# Patient Record
Sex: Female | Born: 2000 | Hispanic: No | Marital: Single | Smoking: Never smoker
Health system: Southern US, Community
[De-identification: ages and names within clinical notes are randomized; demographics above are authoritative.]

## PROBLEM LIST (undated history)

## (undated) ENCOUNTER — Inpatient Hospital Stay (HOSPITAL_COMMUNITY): Payer: Self-pay

## (undated) DIAGNOSIS — J45909 Unspecified asthma, uncomplicated: Secondary | ICD-10-CM

## (undated) HISTORY — PX: BLADDER SURGERY: SHX569

---
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
## 2019-06-14 ENCOUNTER — Other Ambulatory Visit: Payer: Self-pay

## 2019-06-14 ENCOUNTER — Encounter (HOSPITAL_COMMUNITY): Payer: Self-pay

## 2019-06-14 ENCOUNTER — Emergency Department (HOSPITAL_COMMUNITY)
Admission: EM | Admit: 2019-06-14 | Discharge: 2019-06-14 | Disposition: A | Discharge status: Home / Self Care | Payer: Medicaid Other | Attending: Emergency Medicine | Admitting: Emergency Medicine

## 2019-06-14 DIAGNOSIS — R590 Localized enlarged lymph nodes: Secondary | ICD-10-CM | POA: Diagnosis not present

## 2019-06-14 DIAGNOSIS — R0981 Nasal congestion: Secondary | ICD-10-CM | POA: Insufficient documentation

## 2019-06-14 DIAGNOSIS — Z20822 Contact with and (suspected) exposure to covid-19: Secondary | ICD-10-CM | POA: Diagnosis not present

## 2019-06-14 DIAGNOSIS — R519 Headache, unspecified: Secondary | ICD-10-CM | POA: Diagnosis not present

## 2019-06-14 DIAGNOSIS — J029 Acute pharyngitis, unspecified: Secondary | ICD-10-CM | POA: Insufficient documentation

## 2019-06-14 DIAGNOSIS — R05 Cough: Secondary | ICD-10-CM | POA: Insufficient documentation

## 2019-06-14 LAB — I-STAT BETA HCG BLOOD, ED (MC, WL, AP ONLY): I-stat hCG, quantitative: <5 m[IU]/mL (ref <5)

## 2019-06-14 LAB — POC SARS CORONAVIRUS 2 AG -  ED: SARS Coronavirus 2 Ag: NEGATIVE

## 2019-06-14 LAB — SARS CORONAVIRUS 2 (TAT 6-24 HRS): SARS Coronavirus 2: NEGATIVE

## 2019-06-14 LAB — GROUP A STREP BY PCR: Group A Strep by PCR: NOT DETECTED

## 2019-06-14 MED ORDER — ACETAMINOPHEN 500 MG PO TABS
1000.0000 mg | ORAL_TABLET | Freq: Once | ORAL | Status: AC
  Administered 2019-06-14: 1000 mg via ORAL
  Filled 2019-06-14: qty 2

## 2019-06-14 MED ORDER — ACETAMINOPHEN 500 MG PO TABS: 500.0000 mg | ORAL_TABLET | Freq: Four times a day (QID) | ORAL | 0 refills | Status: DC | PRN

## 2019-06-14 MED ORDER — FLUTICASONE PROPIONATE 50 MCG/ACT NA SUSP: 2.0000 | Freq: Every day | NASAL | 0 refills | Status: DC

## 2019-06-14 MED ORDER — BENZONATATE 100 MG PO CAPS: 100.0000 mg | ORAL_CAPSULE | Freq: Three times a day (TID) | ORAL | 0 refills | Status: DC | PRN

## 2019-06-14 MED ORDER — DEXAMETHASONE SODIUM PHOSPHATE 10 MG/ML IJ SOLN
10.0000 mg | Freq: Once | INTRAMUSCULAR | Status: AC
Start: 2019-06-14 — End: 2019-06-14
  Administered 2019-06-14: 10 mg via INTRAMUSCULAR
  Filled 2019-06-14: qty 1

## 2019-06-14 MED ORDER — IBUPROFEN 600 MG PO TABS: 600.0000 mg | ORAL_TABLET | Freq: Four times a day (QID) | ORAL | 0 refills | Status: DC | PRN

## 2019-06-14 MED ORDER — PHENOL 1.4 % MT LIQD: 1.0000 | OROMUCOSAL | 0 refills | Status: DC | PRN

## 2019-06-14 NOTE — ED Provider Notes (Signed)
[facility] EMERGENCY DEPARTMENT Provider Note   CSN: [id number] Arrival date & time: 06/14/19  1247     History No chief complaint on file.   Donna Cobb is a 19 y.o. female presents for evaluation of acute onset, persistent sore throat, headaches, nasal congestion, mild cough for 5 days.  Reports generalized body aches.  Denies shortness of breath, chest pain, abdominal pain.  Also notes that her menstrual cycle has been abnormal and that she started experiencing vaginal spotting 2 days ago.  She has the Nexplanon in place and has had irregular periods with it previously.  Has been taking Tylenol for symptoms.  No known sick contacts, no known Covid exposures.  The history is provided by the patient.       Past Medical History:  Diagnosis Date  . Asthma     Patient Active Problem List   Diagnosis Date Noted  . PPH (postpartum hemorrhage) 02/07/2017  . Nexplanon in place 02/07/2017  . Vacuum-assisted cesarean delivery, delivered, current hospitalization 02/05/2017  . GBS (group B Streptococcus carrier), +RV culture, currently pregnant 01/08/2017  . Limited prenatal care in third trimester 01/06/2017  . 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 Supervision of normal first teen pregnancy in second trimester 10/15/2016  . Supervision of low-risk pregnancy 07/25/2016    Past Surgical History:  Procedure Laterality Date  . BLADDER SURGERY     19 years old "bladder turned the wrong way, difficulty urinating"     OB History    Gravida  2   Para  1   Term  1   Preterm  0   AB  1   Living  1     SAB  1   TAB  0   Ectopic  0   Multiple  0   Live Births  1           Family History  Problem Relation Age of Onset  . Depression Neg Hx     Social History   Tobacco Use  . Smoking status: Never Smoker  . Smokeless tobacco: Never Used  . Tobacco comment: quit in May 2018  Substance Use Topics  . Alcohol  use: Yes  . Drug use: Not Currently    Types: Marijuana    Comment: used before pregnancy    Home Medications Prior to Admission medications   Medication Sig Start Date End Date Taking? Authorizing Provider  acetaminophen (TYLENOL) 500 MG tablet Take 1 tablet (500 mg total) by mouth every 6 (six) hours as needed. 06/14/19   Deloma Spindle A, PA-C  benzonatate (TESSALON) 100 MG capsule Take 1 capsule (100 mg total) by mouth 3 (three) times daily as needed for cough. 06/14/19   Jo-Ann Johanning A, PA-C  fluticasone (FLONASE) 50 MCG/ACT nasal spray Place 2 sprays into both nostrils daily. 06/14/19   Zariah Cavendish A, PA-C  ibuprofen (ADVIL) 600 MG tablet Take 1 tablet (600 mg total) by mouth every 6 (six) hours as needed. 06/14/19   Thaily Hackworth A, PA-C  phenol (CHLORASEPTIC) 1.4 % LIQD Use as directed 1 spray in the mouth or throat as needed for throat irritation / pain. 06/14/19   Lyly Canizales A, PA-C  Prenatal Multivit-Min-Fe-FA (PRENATAL VITAMINS) 0.8 MG tablet Take 1 tablet daily by mouth. Patient not taking: Reported on 12/18/2016 11/19/16   Marny Lowenstein, PA-C    Allergies    Patient has no known allergies.  Review of Systems   Review of  Systems  Constitutional: Positive for chills and fever.  HENT: Positive for congestion and sore throat.   Respiratory: Negative for shortness of breath.   Cardiovascular: Negative for chest pain.  Gastrointestinal: Negative for abdominal pain.  Musculoskeletal: Positive for neck pain.  Neurological: Positive for headaches.    Physical Exam Updated Vital Signs BP 123/77 (BP Location: Left Arm)   Pulse 86   Temp 99.3 F (37.4 C) (Oral)   Resp 16   Ht 5\' 4"  (1.626 m)   Wt 54.4 kg   SpO2 97%   BMI 20.60 kg/m   Physical Exam Vitals and nursing note reviewed.  Constitutional:      General: She is not in acute distress.    Appearance: Normal appearance. She is well-developed.  HENT:     Head: Normocephalic and atraumatic.     Right Ear: Tympanic  membrane and ear canal normal.     Left Ear: Tympanic membrane and ear canal normal.     Nose: Congestion present.     Mouth/Throat:     Mouth: Mucous membranes are moist.     Comments: Posterior oropharynx with 2+ tonsillar hypertrophy and erythema but no exudates, trismus, sublingual abnormalities, or uvular deviation.  Tolerating secretions without difficulty. Eyes:     General:        Right eye: No discharge.        Left eye: No discharge.     Conjunctiva/sclera: Conjunctivae normal.  Neck:     Vascular: No JVD.     Trachea: No tracheal deviation.     Comments: Bilateral anterior cervical lymphadenopathy.  Diffuse tenderness to palpation of the paracervical musculature and anterior structures of neck but no fluctuance, erythema, or abnormal phonation Cardiovascular:     Rate and Rhythm: Normal rate and regular rhythm.     Pulses: Normal pulses.     Heart sounds: Normal heart sounds.  Pulmonary:     Effort: Pulmonary effort is normal.     Breath sounds: Normal breath sounds.  Abdominal:     General: Bowel sounds are normal. There is no distension.     Palpations: Abdomen is soft.     Tenderness: There is no abdominal tenderness. There is no guarding or rebound.  Musculoskeletal:     Cervical back: Normal range of motion and neck supple. No rigidity.  Lymphadenopathy:     Cervical: Cervical adenopathy present.  Skin:    General: Skin is warm and dry.     Findings: No erythema.  Neurological:     Mental Status: She is alert.     Comments: Speech is fluent and goal oriented.  Cranial nerves appear grossly intact.  Moves all extremities spontaneously with intact strength.  Psychiatric:        Behavior: Behavior normal.     ED Results / Procedures / Treatments   Labs (all labs ordered are listed, but only abnormal results are displayed) Labs Reviewed  GROUP A STREP BY PCR  SARS CORONAVIRUS 2 (TAT 6-24 HRS)  I-STAT BETA HCG BLOOD, ED ([facility], [facility], [facility] ONLY)  POC SARS  CORONAVIRUS 2 AG -  ED    EKG None  Radiology No results found.  Procedures Procedures (including critical care time)  Medications Ordered in ED Medications  dexamethasone (DECADRON) injection 10 mg (has no administration in time range)  acetaminophen (TYLENOL) tablet 1,000 mg (1,000 mg Oral Given 06/14/19 1422)    ED Course  I have reviewed the triage vital signs and the nursing notes.  Pertinent labs & imaging results that were available during my care of the patient were reviewed by me and considered in my medical decision making (see chart for details).    MDM Rules/Calculators/A&P                      Drisana Schweickert was evaluated in Emergency Department on 06/14/2019 for the symptoms described in the history of present illness. She was evaluated in the context of the global COVID-19 pandemic, which necessitated consideration that the patient might be at risk for infection with the SARS-CoV-2 virus that causes COVID-19. Institutional protocols and algorithms that pertain to the evaluation of patients at risk for COVID-19 are in a state of rapid change based on information released by regulatory bodies including the CDC and federal and state organizations. These policies and algorithms were followed during the patient's care in the ED.  Patient presenting for evaluation of fever, sore throat, nasal congestion, cough.  She is febrile in the ED mildly tachycardic on initial assessment with vital signs have improved with administration of antipyretic.  As she is nontoxic in appearance. Airway is patent. No meningeal signs or nuchal rigidity noted on examination, she has full range of motion of the cervical spine with no limitation or severe discomfort.  Doubt meningitis.  Her strep test was negative today.  No evidence of peritonsillar abscess or deep space neck infection today.  Suspect likely viral etiology of her symptoms, point-of-care Covid test was negative so we will follow up with  outpatient Covid testing.  Lungs are clear to auscultation bilaterally and she exhibits no evidence of respiratory distress and SPO2 saturations are stable on room air.  She did also complain of some vaginal spotting, reports that her period was late but her pregnancy test today is negative.  Her abdomen and pelvis are soft and nontender on examination with no rebound or guarding.  Doubt PID, TOA, ovarian torsion, ectopic pregnancy or acute surgical abdominal pathology.  She has had irregular periods with her Nexplanon implant.  Recommend follow-up with OB/GYN for reevaluation.  Discussed symptomatic management.  Recommend follow-up with PCP for reevaluation of sore throat and nasal congestion and fever.  Discussed quarantining at home per current CDC guidelines.  Discussed strict ED return precautions.  Patient and her significant other at the bedside verbalized understanding of and agreement with plan and patient is stable for discharge at this time.   Final Clinical Impression(s) / ED Diagnoses Final diagnoses:  Suspected COVID-19 virus infection  Acute pharyngitis, unspecified etiology    Rx / DC Orders ED Discharge Orders         Ordered    acetaminophen (TYLENOL) 500 MG tablet  Every 6 hours PRN     06/14/19 1518    ibuprofen (ADVIL) 600 MG tablet  Every 6 hours PRN     06/14/19 1518    phenol (CHLORASEPTIC) 1.4 % LIQD  As needed     06/14/19 1518    benzonatate (TESSALON) 100 MG capsule  3 times daily PRN     06/14/19 1518    fluticasone (FLONASE) 50 MCG/ACT nasal spray  Daily     06/14/19 1518           Jeanie Sewer, PA-C 06/14/19 1534    Mancel Bale, MD 06/14/19 1642

## 2019-06-14 NOTE — Discharge Instructions (Addendum)
Your rapid Covid test was negative.  Your strep test was negative.  Your send out Covid test will result within 24 hours.  You will receive a phone call if your test results are positive, no phone call if the test results are negative.  Alternate 600 mg of ibuprofen and [id number] mg of Tylenol every 3-4 hours as needed for aches pains or fevers. Do not exceed 4000 mg of Tylenol daily.  Use Tessalon for cough.  Fluticasone nasal spray for nasal congestion.  Phenol throat spray for sore throat.  You can use warm water salt gargles, warm teas, honey, humidifier, steam showers, and throat lozenges for your symptoms.  Drink plenty of fluids and get rest.  If your Covid test is positive you will need to quarantine at home for at least 10 days from when your symptoms began and at least 24 hours fever free without the use of ibuprofen or Tylenol before you can return to work.  If your Covid test is negative contact your employer to discuss with her Covid policy is.  Follow-up with your primary care provider for reevaluation of your symptoms.  Return to the emergency department if any concerning signs or symptoms develop such as persistent vomiting, severe headaches or neck stiffness, high fevers, loss of consciousness.

## 2019-06-14 NOTE — ED Triage Notes (Signed)
Patient has sore throat, posterior neck pain, decreased appetite with nausea. Reports that her menstrual cycle was irregular and only had light spotting x 2 days. Patient alert and oriented, NAD

## 2019-07-04 ENCOUNTER — Encounter (HOSPITAL_COMMUNITY): Payer: Self-pay | Admitting: Emergency Medicine

## 2019-07-04 ENCOUNTER — Emergency Department (HOSPITAL_COMMUNITY)
Admission: EM | Admit: 2019-07-04 | Discharge: 2019-07-05 | Disposition: A | Discharge status: Home / Self Care | Payer: No Typology Code available for payment source | Attending: Emergency Medicine | Admitting: Emergency Medicine

## 2019-07-04 ENCOUNTER — Emergency Department (HOSPITAL_COMMUNITY): Payer: No Typology Code available for payment source

## 2019-07-04 DIAGNOSIS — Y9389 Activity, other specified: Secondary | ICD-10-CM | POA: Diagnosis not present

## 2019-07-04 DIAGNOSIS — M549 Dorsalgia, unspecified: Secondary | ICD-10-CM | POA: Insufficient documentation

## 2019-07-04 DIAGNOSIS — T07XXXA Unspecified multiple injuries, initial encounter: Secondary | ICD-10-CM

## 2019-07-04 DIAGNOSIS — M542 Cervicalgia: Secondary | ICD-10-CM | POA: Diagnosis not present

## 2019-07-04 DIAGNOSIS — S99912A Unspecified injury of left ankle, initial encounter: Secondary | ICD-10-CM | POA: Diagnosis present

## 2019-07-04 DIAGNOSIS — Z79899 Other long term (current) drug therapy: Secondary | ICD-10-CM | POA: Diagnosis not present

## 2019-07-04 DIAGNOSIS — Y998 Other external cause status: Secondary | ICD-10-CM | POA: Insufficient documentation

## 2019-07-04 DIAGNOSIS — Y92410 Unspecified street and highway as the place of occurrence of the external cause: Secondary | ICD-10-CM | POA: Insufficient documentation

## 2019-07-04 DIAGNOSIS — J45909 Unspecified asthma, uncomplicated: Secondary | ICD-10-CM | POA: Diagnosis not present

## 2019-07-04 DIAGNOSIS — S90512A Abrasion, left ankle, initial encounter: Secondary | ICD-10-CM | POA: Diagnosis not present

## 2019-07-04 MED ORDER — IBUPROFEN 800 MG PO TABS
800.0000 mg | ORAL_TABLET | Freq: Once | ORAL | Status: AC
  Administered 2019-07-04: 800 mg via ORAL
  Filled 2019-07-04: qty 1

## 2019-07-04 MED ORDER — ACETAMINOPHEN 325 MG PO TABS
650.0000 mg | ORAL_TABLET | Freq: Once | ORAL | Status: AC
  Administered 2019-07-04: 650 mg via ORAL
  Filled 2019-07-04: qty 2
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
## 2019-07-05 MED ORDER — IBUPROFEN 600 MG PO TABS: 600.0000 mg | ORAL_TABLET | Freq: Four times a day (QID) | ORAL | 0 refills | Status: DC | PRN

## 2019-07-05 NOTE — Discharge Instructions (Signed)
Your x-rays are negative for serious traumatic injury.  Use anti-inflammatories such as ibuprofen every 6 hours as needed for aches and pains over the next several days.  Follow-up with your doctor.  Return to the ED with new or worsening symptoms

## 2020-06-29 ENCOUNTER — Ambulatory Visit: Payer: Medicaid Other | Admitting: Obstetrics and Gynecology
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
## 2021-05-30 ENCOUNTER — Ambulatory Visit: Payer: Medicaid Other | Admitting: Obstetrics and Gynecology

## 2021-06-08 ENCOUNTER — Ambulatory Visit (INDEPENDENT_AMBULATORY_CARE_PROVIDER_SITE_OTHER): Payer: Medicaid Other | Admitting: Obstetrics & Gynecology

## 2021-06-08 ENCOUNTER — Encounter: Payer: Self-pay | Admitting: Family Medicine

## 2021-06-08 ENCOUNTER — Encounter: Payer: Self-pay | Admitting: Obstetrics & Gynecology

## 2021-06-08 VITALS — BP 107/72 | HR 79 | Ht 63.0 in | Wt 122.1 lb

## 2021-06-08 DIAGNOSIS — Z3046 Encounter for surveillance of implantable subdermal contraceptive: Secondary | ICD-10-CM | POA: Diagnosis not present

## 2021-06-08 DIAGNOSIS — Z3009 Encounter for other general counseling and advice on contraception: Secondary | ICD-10-CM

## 2021-06-08 DIAGNOSIS — Z30013 Encounter for initial prescription of injectable contraceptive: Secondary | ICD-10-CM

## 2021-06-08 LAB — POCT URINE PREGNANCY: Preg Test, Ur: NEGATIVE

## 2021-06-08 MED ORDER — MEDROXYPROGESTERONE ACETATE 150 MG/ML IM SUSP: 150.0000 mg | Freq: Once | INTRAMUSCULAR | Status: DC

## 2021-06-08 MED ORDER — MEDROXYPROGESTERONE ACETATE 150 MG/ML IM SUSP
150.0000 mg | Freq: Once | INTRAMUSCULAR | Status: AC
  Administered 2021-06-08: 150 mg via INTRAMUSCULAR

## 2021-06-08 NOTE — Progress Notes (Signed)
GYNECOLOGY OFFICE PROCEDURE NOTE  Donna Cobb is a 21 y.o. G2P1011 here for Nexplanon removal.     Nexplanon Removal Patient identified, informed consent performed, consent signed.   Appropriate time out taken. Nexplanon site identified.  Area prepped in usual sterile fashon. One ml of 1% lidocaine was used to anesthetize the area at the distal end of the implant. A small stab incision was made right beside the implant on the distal portion.  The Nexplanon rod was grasped using hemostats and removed without difficulty.  There was minimal blood loss. There were no complications.  3 ml of 1% lidocaine was injected around the incision for post-procedure analgesia.  Steri-strips were applied over the small incision.  A pressure bandage was applied to reduce any bruising.  The patient tolerated the procedure well and was given post procedure instructions.  Patient is planning to use depo provera for contraception       Adam Phenix, MD Attending Obstetrician & Gynecologist, [facility] and Center for [facility]  06/08/2021

## 2021-09-10 ENCOUNTER — Ambulatory Visit (INDEPENDENT_AMBULATORY_CARE_PROVIDER_SITE_OTHER): Payer: Medicaid Other | Admitting: Lactation Services

## 2021-09-10 ENCOUNTER — Encounter: Payer: Self-pay | Admitting: Lactation Services

## 2021-09-10 ENCOUNTER — Other Ambulatory Visit: Payer: Self-pay

## 2021-09-10 VITALS — BP 112/72 | HR 101 | Ht 63.0 in | Wt 120.9 lb

## 2021-09-10 DIAGNOSIS — Z3042 Encounter for surveillance of injectable contraceptive: Secondary | ICD-10-CM | POA: Diagnosis not present

## 2021-09-10 MED ORDER — MEDROXYPROGESTERONE ACETATE 150 MG/ML IM SUSP
150.0000 mg | Freq: Once | INTRAMUSCULAR | Status: AC
  Administered 2021-09-10: 150 mg via INTRAMUSCULAR

## 2021-09-10 NOTE — Progress Notes (Signed)
Donna Cobb here for Depo-Provera Injection. Injection administered without complication. Patient will return in 3 months for next injection between 11/26/2021 and 12/10/2021. Next annual visit due 06/09/2022. Patient reports no questions or concerns.   Ed Blalock, RN 09/10/2021  9:51 AM

## 2021-09-12 NOTE — Progress Notes (Signed)
Patient was assessed and managed by nursing staff during this encounter. I have reviewed the chart and agree with the documentation and plan. I have also made any necessary editorial changes.  [place], MD 09/12/2021 1:54 PM

## 2021-11-26 ENCOUNTER — Ambulatory Visit: Payer: Medicaid Other

## 2021-12-18 ENCOUNTER — Telehealth: Payer: Medicaid Other | Admitting: Physician Assistant

## 2021-12-18 DIAGNOSIS — R102 Pelvic and perineal pain: Secondary | ICD-10-CM

## 2021-12-18 DIAGNOSIS — N898 Other specified noninflammatory disorders of vagina: Secondary | ICD-10-CM

## 2021-12-19 NOTE — Progress Notes (Signed)
Because of ongoing vaginal dryness and pain on intercourse which, especially at a young age, warrants examination and hormone testing, I feel your condition warrants further evaluation and I recommend that you be seen in a face to face visit.   NOTE: There will be NO CHARGE for this eVisit   If you are having a true medical emergency please call 911.      For an urgent face to face visit, Donna Cobb has seven urgent care centers for your convenience:     [facility] at [facility] Directions [phone] [address] [place], [place] [address]    [facility] Center [facility]) Get Driving Directions [phone] [address] [place], [place] [address]  [facility]) Get Driving Directions [phone] [address] [place],  [place]  [address]  [facility]gent [facility] Center [facility] - at [employer] Directions  [phone] [address] [place],  [place] [address]   [facility] Urgent Care at [facility] Get Driving Directions [phone] [address] [place], [place] [address]   [facility] Health Urgent Care at [facility] Get Driving Directions  [phone] [address] [place], [place] [address]   [facility] Urgent Care at [facility] Directions [phone] [address], [address] [place], [place] [address]  Your MyChart E-visit questionnaire answers were reviewed by a board certified advanced clinical practitioner to complete your personal care plan based on your specific symptoms.  Thank you for using e-Visits.

## 2021-12-20 ENCOUNTER — Other Ambulatory Visit (HOSPITAL_COMMUNITY)
Admission: RE | Admit: 2021-12-20 | Discharge: 2021-12-20 | Disposition: A | Discharge status: Home / Self Care | Payer: Medicaid Other | Source: Ambulatory Visit | Attending: Obstetrics and Gynecology | Admitting: Obstetrics and Gynecology

## 2021-12-20 ENCOUNTER — Ambulatory Visit (INDEPENDENT_AMBULATORY_CARE_PROVIDER_SITE_OTHER): Payer: Medicaid Other | Admitting: Obstetrics and Gynecology

## 2021-12-20 ENCOUNTER — Encounter: Payer: Self-pay | Admitting: Obstetrics and Gynecology

## 2021-12-20 VITALS — BP 126/73 | HR 94

## 2021-12-20 DIAGNOSIS — N898 Other specified noninflammatory disorders of vagina: Secondary | ICD-10-CM

## 2021-12-20 DIAGNOSIS — Z124 Encounter for screening for malignant neoplasm of cervix: Secondary | ICD-10-CM | POA: Insufficient documentation

## 2021-12-20 DIAGNOSIS — Z3042 Encounter for surveillance of injectable contraceptive: Secondary | ICD-10-CM | POA: Diagnosis not present

## 2021-12-20 LAB — POCT PREGNANCY, URINE: Preg Test, Ur: NEGATIVE

## 2021-12-20 MED ORDER — PREMARIN 0.625 MG/GM VA CREA: 1.0000 | TOPICAL_CREAM | Freq: Every day | VAGINAL | 12 refills | Status: DC

## 2021-12-20 MED ORDER — MEDROXYPROGESTERONE ACETATE 150 MG/ML IM SUSP
150.0000 mg | Freq: Once | INTRAMUSCULAR | Status: AC
  Administered 2021-12-20: 150 mg via INTRAMUSCULAR

## 2021-12-20 NOTE — Patient Instructions (Addendum)
Apply vaginal estrogen nightly for 2 weeks then 3 nights a week afterwards  Check how vaginal estrogen is working for dryness.   Can use coconut oil for moisture and as a lubricant

## 2021-12-20 NOTE — Progress Notes (Signed)
Donna Cobb here for Depo-Provera Injection. Injection administered without complication. Patient will return in 3 months for next injection between 02/22 and 03/08. Next annual visit due 05/2022.   Christella Scheuermann, RN 12/20/2021  12:00 PM

## 2021-12-20 NOTE — Progress Notes (Signed)
NEW GYNECOLOGY PATIENT Patient name: Donna Cobb MRN [id number]  Date of birth: Nov 12, 2000 Chief Complaint:   Vaginal Dryness     History:  Donna Cobb is a 21 y.o. G2P1011 being seen today for vaginal dryness.    Vaginal dryness for 1.5 years. Has tried lubrication and OTC suppositories - would go away when in the shower. Not right now feeling dry may feel irritation. Having hot flashes for 2 months. Pain with intercourse due to dryness. Abnormal vaginal discharge -often having yeast infections and not sure why. No new medication changes, weight changes or events around the time of her vaginal dryness Shaving for hair removal. No soaps used. No itching. "Yeast" ifnetion is usually a smell and will use pH balance pills and use them once a day (boric acid) and that usually clears that up. Last infection was about 2 weeks ago.   Depo for birth control. No intention of getting pregnant soon.   Baby in 2019, no issues with sexual intercourse following delivery.    Gynecologic History No LMP recorded. Patient has had an implant. Contraception: Depo-Provera injections Last Pap: not previously indicated.  Last Mammogram: n/a Last Colonoscopy: n/a  Obstetric History OB History  Gravida Para Term Preterm AB Living  2 1 1  0 1 1  SAB IAB Ectopic Multiple Live Births  1 0 0 0 1    # Outcome Date GA Lbr Len/2nd Weight Sex Delivery Anes PTL Lv  2 Term 02/05/17 [redacted]w[redacted]d 03:17 / 03:12 8 lb 10.3 oz (3.921 kg) M Vag-Vacuum EPI  LIV     Birth Comments: WNL  1 SAB  [redacted]w[redacted]d           Past Medical History:  Diagnosis Date   Asthma     Past Surgical History:  Procedure Laterality Date   BLADDER SURGERY     21 years old "bladder turned the wrong way, difficulty urinating"    Current Outpatient Medications on File Prior to Visit  Medication Sig Dispense Refill   acetaminophen (TYLENOL) 500 MG tablet Take 1 tablet (500 mg total) by mouth every 6 (six) hours as needed. (Patient not taking: Reported  on 09/10/2021) 30 tablet 0   diphenhydrAMINE (BENADRYL) 25 MG tablet Take 50 mg by mouth every 6 (six) hours as needed for itching or allergies. (Patient not taking: Reported on 09/10/2021)     ibuprofen (ADVIL) 600 MG tablet Take 1 tablet (600 mg total) by mouth every 6 (six) hours as needed. (Patient not taking: Reported on 09/10/2021) 30 tablet 0   No current facility-administered medications on file prior to visit.    No Known Allergies  Social History:  reports that she has never smoked. She has never used smokeless tobacco. She reports current alcohol use of about 1.0 standard drink of alcohol per week. She reports current drug use. Drug: Marijuana.  Family History  Problem Relation Age of Onset   Depression Neg Hx     The following portions of the patient's history were reviewed and updated as appropriate: allergies, current medications, past family history, past medical history, past social history, past surgical history and problem list.  Review of Systems Pertinent items noted in HPI and remainder of comprehensive ROS otherwise negative.  Physical Exam:  BP 126/73   Pulse 94  Physical Exam Vitals and nursing note reviewed. Exam conducted with a chaperone present.  Constitutional:      Appearance: Normal appearance.  Cardiovascular:     Rate and Rhythm: Normal rate.  Pulmonary:     Effort: Pulmonary effort is normal.     Breath sounds: Normal breath sounds.  Genitourinary:    General: Normal vulva.     Comments: Butterfly position  Slight allodynia at 7 oclock No erythema of introitus Normal appearing cervix  Normal vaginal canal  Neurological:     General: No focal deficit present.     Mental Status: She is alert and oriented to person, place, and time.  Psychiatric:        Mood and Affect: Mood normal.        Behavior: Behavior normal.        Thought Content: Thought content normal.        Judgment: Judgment normal.      Assessment and Plan:   1. Vaginal  dryness POI labs given report of vaginal dryness and hot flashes. Could be symptoms of systemic progesterone use though symptoms predate initiation of depo provera. Encouraged to monitor if symptoms worsen with depo administration. Start vaginal estrogen to help with dryness; can also use coconut oil for lubrication and moisture.  - Anti mullerian hormone - Estradiol - FSH - TSH Rfx on Abnormal to Free T4   2. Screening for cervical cancer Routine initial pap collected - Cytology - PAP( [place])  Routine preventative health maintenance measures emphasized. Please refer to After Visit Summary for other counseling recommendations.   Follow-up: Return in about 3 months (around 03/21/2022) for GYN Follow Up.      Lorriane Shire, MD Obstetrician & Gynecologist, [facility] Center for [employer], [facility]

## 2021-12-21 LAB — TSH RFX ON ABNORMAL TO FREE T4: TSH: 0.689 u[IU]/mL (ref 0.450–4.500)

## 2021-12-24 LAB — FOLLICLE STIMULATING HORMONE: FSH: 5 m[IU]/mL

## 2021-12-24 LAB — CYTOLOGY - PAP

## 2021-12-24 LAB — ESTRADIOL: Estradiol: 14.9 pg/mL

## 2021-12-24 LAB — ANTI MULLERIAN HORMONE: ANTI-MULLERIAN HORMONE (AMH): 0.873 ng/mL

## 2022-04-01 ENCOUNTER — Ambulatory Visit: Payer: Medicaid Other | Admitting: Obstetrics and Gynecology

## 2023-03-20 ENCOUNTER — Ambulatory Visit: Payer: Medicaid Other | Admitting: Family Medicine

## 2023-03-20 ENCOUNTER — Encounter: Payer: Self-pay | Admitting: Family Medicine

## 2023-03-20 ENCOUNTER — Other Ambulatory Visit (HOSPITAL_COMMUNITY)
Admission: RE | Admit: 2023-03-20 | Discharge: 2023-03-20 | Disposition: A | Discharge status: Home / Self Care | Source: Ambulatory Visit | Attending: Family Medicine | Admitting: Family Medicine

## 2023-03-20 VITALS — BP 102/60 | Ht 64.0 in | Wt 123.8 lb

## 2023-03-20 DIAGNOSIS — N898 Other specified noninflammatory disorders of vagina: Secondary | ICD-10-CM | POA: Insufficient documentation

## 2023-03-20 DIAGNOSIS — Z114 Encounter for screening for human immunodeficiency virus [HIV]: Secondary | ICD-10-CM | POA: Diagnosis not present

## 2023-03-20 DIAGNOSIS — Z1159 Encounter for screening for other viral diseases: Secondary | ICD-10-CM

## 2023-03-20 DIAGNOSIS — B351 Tinea unguium: Secondary | ICD-10-CM

## 2023-03-20 LAB — COMPREHENSIVE METABOLIC PANEL
ALT: 15 U/L (ref 0–35)
AST: 20 U/L (ref 0–37)
Albumin: 4.6 g/dL (ref 3.5–5.2)
Alkaline Phosphatase: 48 U/L (ref 39–117)
BUN: 10 mg/dL (ref 6–23)
CO2: 27 meq/L (ref 19–32)
Calcium: 9.4 mg/dL (ref 8.4–10.5)
Chloride: 103 meq/L (ref 96–112)
Creatinine, Ser: 0.66 mg/dL (ref 0.40–1.20)
GFR: 123.29 mL/min (ref >60.00)
Glucose, Bld: 85 mg/dL (ref 70–99)
Potassium: 4.1 meq/L (ref 3.5–5.1)
Sodium: 136 meq/L (ref 135–145)
Total Bilirubin: 0.9 mg/dL (ref 0.2–1.2)
Total Protein: 7.6 g/dL (ref 6.0–8.3)

## 2023-03-20 MED ORDER — TERBINAFINE HCL 250 MG PO TABS: 250.0000 mg | ORAL_TABLET | Freq: Every day | ORAL | 0 refills | Status: AC

## 2023-03-20 NOTE — Patient Instructions (Addendum)
-  It was nice to meet you and look forward to taking care of you.  -Ordered labs. Office will call with lab results.  -Prescribed Lamisil 250mg  daily for 90 days for toenail fungus. DO NOT PICK UP MEDICATION OR TAKE MEDICATION UNTIL YOUR LABS HAVE RETURNED AND YOU HAVE BEEN NOTIFIED.  -Placed a referral to GYN. Please call the office or send a MyChart message if you do not receive a phone call or MyChart message in 2 weeks about an appointment.  -Follow up in a year for a physical.

## 2023-03-20 NOTE — Progress Notes (Signed)
 New Patient Office Visit  Subjective   Patient ID: Donna Cobb, female    DOB: [birth year]  Age: 23 y.o. MRN: [id number]  CC:  Chief Complaint  Patient presents with   Personal Problem   New Patient (Initial Visit)    HPI Donna Cobb presents to establish care with new provider.   Patients previous primary care provider [facility] with Bertram Denver, NP. Last in 10/2018.   Specialist: None   Patient is having vaginal itch, bad odor, and discharge. She reports her discharge is "creamy egg white color, thick". Started about 3 months ago. She tried OTC oral medication for yeast infection. Seemed to help with itch and odor, but not the discharge. Once the odor came back, she took an OTC ph oral medication. Sexually active with one partner, not had intercourse in over a month, and not using any protection.   Also, reports she has had some vaginal dryness and concerns. She reports she has previously been on medication.   Patient is concerned about her left pinky toe being black. She reports this started 5 years ago, but now her fourth left toenail is turning a brownish-black. Not used or took any medication for symptoms.   Outpatient Encounter Medications as of 03/20/2023  Medication Sig   terbinafine (LAMISIL) 250 MG tablet Take 1 tablet (250 mg total) by mouth daily.   [DISCONTINUED] acetaminophen (TYLENOL) 500 MG tablet Take 1 tablet (500 mg total) by mouth every 6 (six) hours as needed. (Patient not taking: Reported on 09/10/2021)   [DISCONTINUED] conjugated estrogens (PREMARIN) vaginal cream Place 1 Applicatorful vaginally daily.   [DISCONTINUED] diphenhydrAMINE (BENADRYL) 25 MG tablet Take 50 mg by mouth every 6 (six) hours as needed for itching or allergies. (Patient not taking: Reported on 09/10/2021)   [DISCONTINUED] ibuprofen (ADVIL) 600 MG tablet Take 1 tablet (600 mg total) by mouth every 6 (six) hours as needed. (Patient not taking: Reported on  09/10/2021)   No facility-administered encounter medications on file as of 03/20/2023.    Past Medical History:  Diagnosis Date   Asthma    GBS (group B Streptococcus carrier), +RV culture, currently pregnant 01/08/2017   Vacuum-assisted cesarean delivery, delivered, current hospitalization 02/05/2017   Please schedule this patient for Postpartum visit in: 4 weeks with the following provider: MD  Low risk pregnancy complicated by: none  Delivery mode:  Vacuum   Anticipated Birth Control:  Nexplanon  PP Procedures needed: none    Schedule Integrated BH visit: no    Past Surgical History:  Procedure Laterality Date   BLADDER SURGERY     23 years old "bladder turned the wrong way, difficulty urinating"    Family History  Problem Relation Age of Onset   Depression Neg Hx     Social History   Socioeconomic History   Marital status: Single    Spouse name: Not on file   Number of children: 1   Years of education: Not on file   Highest education level: High school graduate  Occupational History   Not on file  Tobacco Use   Smoking status: Never   Smokeless tobacco: Never   Tobacco comments:    quit in May 2018  Vaping Use   Vaping status: Every Day  Substance and Sexual Activity   Alcohol use: Yes    Alcohol/week: 1.0 standard drink of alcohol    Types: 1 Standard drinks or equivalent per week    Comment: Once or twice a week  Drug use: Yes    Types: Marijuana    Comment: Daily   Sexual activity: Yes    Birth control/protection: None  Other Topics Concern   Not on file  Social History Narrative   Not on file   Social Drivers of Health   Financial Resource Strain: Low Risk  (03/20/2023)   Overall Financial Resource Strain (CARDIA)    Difficulty of Paying Living Expenses: Not very hard  Food Insecurity: Food Insecurity Present (06/08/2021)   Hunger Vital Sign    Worried About Running Out of Food in the Last Year: Sometimes true    Ran Out of Food in the Last Year:  Sometimes true  Transportation Needs: Unmet Transportation Needs (06/08/2021)   PRAPARE - Transportation    Lack of Transportation (Medical): Yes    Lack of Transportation (Non-Medical): Yes  Physical Activity: Inactive (03/20/2023)   Exercise Vital Sign    Days of Exercise per Week: 0 days    Minutes of Exercise per Session: 0 min  Stress: Stress Concern Present (03/20/2023)   [employer] of Occupational Health - Occupational Stress Questionnaire    Feeling of Stress : Rather much  Social Connections: Socially Isolated (03/20/2023)   Social Connection and Isolation Panel [NHANES]    Frequency of Communication with Friends and Family: Never    Frequency of Social Gatherings with Friends and Family: Never    Attends Religious Services: Never    Database administrator or Organizations: No    Attends Banker Meetings: Never    Marital Status: Never married  Intimate Partner Violence: Not At Risk (03/20/2023)   Humiliation, Afraid, Rape, and Kick questionnaire    Fear of Current or Ex-Partner: No    Emotionally Abused: No    Physically Abused: No    Sexually Abused: No    ROS See HPI above    Objective  BP 102/60 (BP Location: Left Arm, Patient Position: Sitting, Cuff Size: Normal)   Ht 5\' 4"  (1.626 m)   Wt 123 lb 12.8 oz (56.2 kg)   BMI 21.25 kg/m   Physical Exam Vitals reviewed.  Constitutional:      General: She is not in acute distress.    Appearance: Normal appearance. She is not ill-appearing, toxic-appearing or diaphoretic.  HENT:     Head: Normocephalic and atraumatic.  Eyes:     General:        Right eye: No discharge.        Left eye: No discharge.     Conjunctiva/sclera: Conjunctivae normal.  Cardiovascular:     Rate and Rhythm: Normal rate and regular rhythm.     Heart sounds: Normal heart sounds. No murmur heard.    No friction rub. No gallop.  Pulmonary:     Effort: Pulmonary effort is normal. No respiratory distress.     Breath sounds:  Normal breath sounds.  Musculoskeletal:        General: Normal range of motion.  Feet:     Comments: Left pinky and fourth toe is partial black. Hard to visualize the whole toenail with having gel polish on.  Skin:    General: Skin is warm and dry.  Neurological:     General: No focal deficit present.     Mental Status: She is alert and oriented to person, place, and time. Mental status is at baseline.  Psychiatric:        Mood and Affect: Mood normal.        Behavior:  Behavior normal.        Thought Content: Thought content normal.        Judgment: Judgment normal.      Assessment & Plan:  Vaginal discharge -     Cervicovaginal ancillary only  Vaginal odor -     Cervicovaginal ancillary only  Vaginal itching -     Cervicovaginal ancillary only  Encounter for screening for HIV -     HIV Antibody (routine testing w rflx)  Vaginal dryness -     Ambulatory referral to Gynecology  Need for hepatitis C screening test -     Hepatitis C antibody  Toenail fungus -     Comprehensive metabolic panel -     Terbinafine HCl; Take 1 tablet (250 mg total) by mouth daily.  Dispense: 90 tablet; Refill: 0   1.Review health maintenance:  -Chlamydia screening: 2018, but ordered today  -Covid booster: Declines  -Hep C screening: Ordered  -HPV vaccines: Declines  -Influenza vaccine: Declines  2.Ordered labs (cervicovaginal self swab for vaginal itching, discharge, and odor), HIV and Hep C for screening, and CMP to assess liver function before starting medication. Office will call with lab results.  3.Prescribed Lamisil 250mg  daily for 90 days for toenail fungus. Advised to NOT PICK UP MEDICATION OR TAKE MEDICATION UNTIL HER LABS HAVE RETURNED AND SHE HAS BEEN NOTIFIED.  4.Placed a referral to GYN for vaginal dryness. Advised to please call the office or send a MyChart message if she  does not receive a phone call or MyChart message in 2 weeks about an appointment.  Return in about 1  month (around 04/20/2023) for physical.   Zandra Abts, NP

## 2023-03-21 ENCOUNTER — Encounter: Payer: Self-pay | Admitting: Family Medicine

## 2023-03-21 LAB — CERVICOVAGINAL ANCILLARY ONLY
Bacterial Vaginitis (gardnerella): POSITIVE — AB
Candida Glabrata: NEGATIVE
Candida Vaginitis: NEGATIVE
Chlamydia: NEGATIVE
Comment: NEGATIVE
Comment: NEGATIVE
Comment: NEGATIVE
Comment: NEGATIVE
Comment: NEGATIVE
Comment: NORMAL
Neisseria Gonorrhea: NEGATIVE
Trichomonas: POSITIVE — AB

## 2023-03-21 LAB — HEPATITIS C ANTIBODY: Hepatitis C Ab: NONREACTIVE

## 2023-03-21 LAB — HIV ANTIBODY (ROUTINE TESTING W REFLEX): HIV 1&2 Ab, 4th Generation: NONREACTIVE

## 2023-03-24 MED ORDER — METRONIDAZOLE 500 MG PO TABS: 500.0000 mg | ORAL_TABLET | Freq: Three times a day (TID) | ORAL | 0 refills | Status: AC

## 2023-03-24 NOTE — Telephone Encounter (Signed)
 Sent to her pharmacy.

## 2023-03-24 NOTE — Addendum Note (Signed)
 Addended by: Kenna Gilbert B on: 03/24/2023 09:09 AM   Modules accepted: Orders

## 2023-09-04 ENCOUNTER — Ambulatory Visit: Admitting: Obstetrics and Gynecology
